# Patient Record
Sex: Male | Born: 1985 | Race: Black or African American | Hispanic: No | Marital: Married | Smoking: Never smoker
Health system: Southern US, Community
[De-identification: ages and names within clinical notes are randomized; demographics above are authoritative.]

---

## 2009-02-27 ENCOUNTER — Ambulatory Visit: Payer: Self-pay | Admitting: Family Medicine

## 2009-11-06 ENCOUNTER — Ambulatory Visit: Payer: Self-pay | Admitting: Family Medicine

## 2010-11-10 NOTE — Assessment & Plan Note (Signed)
Summary: SINUS & COUGH/KH   Vital Signs:  Patient Profile:   25 Years Old Male CC:      body aches/cold symptoms Height:     74 inches Weight:      227 pounds O2 Sat:      98 % O2 treatment:    Room Air Temp:     100.8 degrees F oral Pulse rate:   98 / minute Resp:     18 per minute BP sitting:   160 / 85  (right arm)  Pt. in pain?   yes    Location:   head    Type:       aching  Vitals Entered By: Lita Mains, RN                   Updated Prior Medication List: No Medications Current Allergies (reviewed today): No known allergies History of Present Illness History from: patient Chief Complaint: body aches/cold symptoms History of Present Illness: Patient c/o body aches and HA X 3 days. Last night the symptoms began to worsen and he developed runny nose, cough, eye pain, chills and sinus congestion. He complains his eye pain prevent him from being able to look left and right. Has has not taken any OTC meidcation. His current temp is 100.8 orally. NO VOMITING OR DIARRHEA. COUGH IS NON PRODUCTIVE AND CAUSES CHEST TO HURT.   REVIEW OF SYSTEMS Constitutional Symptoms       Complains of fever and chills.     Denies night sweats, weight loss, weight gain, and fatigue.  Eyes       Complains of eye pain.      Denies change in vision, eye discharge, glasses, contact lenses, and eye surgery. Ear/Nose/Throat/Mouth       Complains of frequent runny nose and sinus problems.      Denies hearing loss/aids, change in hearing, ear pain, ear discharge, dizziness, frequent nose bleeds, sore throat, hoarseness, and tooth pain or bleeding.  Respiratory       Complains of dry cough.      Denies productive cough, wheezing, shortness of breath, asthma, bronchitis, and emphysema/COPD.  Cardiovascular       Denies murmurs, chest pain, and tires easily with exhertion.    Gastrointestinal       Denies stomach pain, nausea/vomiting, diarrhea, constipation, blood in bowel movements, and  indigestion. Genitourniary       Denies painful urination, kidney stones, and loss of urinary control. Neurological       Complains of headaches.      Denies paralysis, seizures, and fainting/blackouts. Musculoskeletal       Complains of muscle pain and joint pain.      Denies joint stiffness, decreased range of motion, redness, swelling, muscle weakness, and gout.  Skin       Denies bruising, unusual mles/lumps or sores, and hair/skin or nail changes.  Psych       Denies mood changes, temper/anger issues, anxiety/stress, speech problems, depression, and sleep problems.  Past History:  Past Medical History: Reviewed history from 02/27/2009 and no changes required. Unremarkable  Past Surgical History: Reviewed history from 02/27/2009 and no changes required. Denies surgical history  Family History: none/unknown  Social History: Reviewed history from 02/27/2009 and no changes required. Married Never Smoked Alcohol use-yes, socially Drug use-no Regular exercise-yes Physical Exam General appearance: well developed, well nourished, no acute distress Eyes: CONJUNCTIVA INJECTED Ears: normal, no lesions or deformities Nasal: CONGESTED Oral/Pharynx: MILD ERYTHEMA Neck: neck  supple,  trachea midline, no masses Chest/Lungs: no rales, wheezes, or rhonchi bilateral, breath sounds equal without effort Heart: regular rate and  rhythm, no murmur Skin: no obvious rashes or lesions FEEL HE HAS URI OR POS INFLUENZA Assessment New Problems: UPPER RESPIRATORY INFECTION, ACUTE (ICD-465.9)   Plan New Medications/Changes: CHERATUSSIN AC 100-10 MG/5ML SYRP (GUAIFENESIN-CODEINE) 1-2 TSP by mouth Q 6 HRS as needed COUGH AND CONGESTION  #4 OZ x 0, 11/06/2009, Marvis Moeller DO AMOXICILLIN 500 MG CAPS (AMOXICILLIN) 1 by mouth TID  #30 x 0, 11/06/2009, Marvis Moeller DO  New Orders: Est. Patient Level II [62952]   Prescriptions: CHERATUSSIN AC 100-10 MG/5ML SYRP (GUAIFENESIN-CODEINE) 1-2  TSP by mouth Q 6 HRS as needed COUGH AND CONGESTION  #4 OZ x 0   Entered and Authorized by:   Marvis Moeller DO   Signed by:   Marvis Moeller DO on 11/06/2009   Method used:   Print then Give to Patient   RxID:   [id number] AMOXICILLIN 500 MG CAPS (AMOXICILLIN) 1 by mouth TID  #30 x 0   Entered and Authorized by:   Marvis Moeller DO   Signed by:   Marvis Moeller DO on 11/06/2009   Method used:   Print then Give to Patient   RxID:   [id number]   Patient Instructions: 1)  TAKE TYLENOL AT 12 AND 6, MOTRIN AT 3 AND 9 FOR FEVER AND DISCOMFORT. AVOID CAFFEINE AND MILK PRODUCTS. FOLLOW UP WITH YOUR PCP OR RETURN IF SYMPTOMS PERSIST OR WORSEN.

## 2010-11-10 NOTE — Letter (Signed)
Summary: Out of Work  [facility]  [address]   [place], [place] [address]   Phone: [phone]  Fax: [phone]    November 06, 2009   Employee:  DAIMIEN PATMON    To Whom It May Concern:   For Medical reasons, please excuse the above named employee from work for the following dates:  Start:   06 Nov 2009  End:   09 Nov 2009  If you need additional information, please feel free to contact our office.         Sincerely,    Marvis Moeller DO

## 2014-04-21 ENCOUNTER — Encounter: Payer: Self-pay | Admitting: Emergency Medicine

## 2014-04-21 ENCOUNTER — Emergency Department (INDEPENDENT_AMBULATORY_CARE_PROVIDER_SITE_OTHER)
Admission: EM | Admit: 2014-04-21 | Discharge: 2014-04-21 | Disposition: A | Discharge status: Home / Self Care | Payer: BC Managed Care – PPO | Source: Home / Self Care | Attending: Family Medicine | Admitting: Family Medicine

## 2014-04-21 DIAGNOSIS — K029 Dental caries, unspecified: Secondary | ICD-10-CM

## 2014-04-21 MED ORDER — TRAMADOL HCL 50 MG PO TABS: ORAL_TABLET | ORAL | Status: DC

## 2014-04-21 MED ORDER — CLINDAMYCIN HCL 300 MG PO CAPS: 300.0000 mg | ORAL_CAPSULE | Freq: Four times a day (QID) | ORAL | Status: DC

## 2014-04-21 NOTE — Discharge Instructions (Signed)
May take Ibuprofen 200mg , 4 tabs every 8 hours with food.  Begin warm compresses several times daily. If symptoms become significantly worse during the night or over the weekend, proceed to the local emergency room.    Dental Pain A tooth ache may be caused by cavities (tooth decay). Cavities expose the nerve of the tooth to air and hot or cold temperatures. It may come from an infection or abscess (also called a boil or furuncle) around your tooth. It is also often caused by dental caries (tooth decay). This causes the pain you are having. DIAGNOSIS  Your caregiver can diagnose this problem by exam. TREATMENT   If caused by an infection, it may be treated with medications which kill germs (antibiotics) and pain medications as prescribed by your caregiver. Take medications as directed.  Only take over-the-counter or prescription medicines for pain, discomfort, or fever as directed by your caregiver.  Whether the tooth ache today is caused by infection or dental disease, you should see your dentist as soon as possible for further care. SEEK MEDICAL CARE IF: The exam and treatment you received today has been provided on an emergency basis only. This is not a substitute for complete medical or dental care. If your problem worsens or new problems (symptoms) appear, and you are unable to meet with your dentist, call or return to this location. SEEK IMMEDIATE MEDICAL CARE IF:   You have a fever.  You develop redness and swelling of your face, jaw, or neck.  You are unable to open your mouth.  You have severe pain uncontrolled by pain medicine. MAKE SURE YOU:   Understand these instructions.  Will watch your condition.  Will get help right away if you are not doing well or get worse. Document Released: 09/27/2005 Document Revised: 12/20/2011 Document Reviewed: 05/15/2008 [facility] Patient Information 2015 [place], [place]. This information is not intended to replace advice given to you by  your health care provider. Make sure you discuss any questions you have with your health care provider.

## 2014-04-21 NOTE — ED Provider Notes (Signed)
CSN: [id number]     Arrival date & time 04/21/14  1512 History   First MD Initiated Contact with Patient 04/21/14 1557     Chief Complaint  Patient presents with  . Dental Pain    R lower post.      HPI Comments: Patient complains of soreness in a right lower tooth for a week.  The tooth has become painful over the past 3 days, with pain radiating to his right cheek and neck.  He has had night sweats for the past two days.  He has a dental appointment scheduled in 3 days.  Patient is a 28 y.o. male presenting with tooth pain. The history is provided by the patient.  Dental Pain Location:  Lower Lower teeth location:  32/RL 3rd molar Quality:  Aching Severity:  Moderate Onset quality:  Gradual Duration:  3 days Timing:  Constant Progression:  Worsening Chronicity:  Recurrent Relieved by:  Nothing Exacerbated by: eating. Ineffective treatments:  NSAIDs Associated symptoms: facial pain, facial swelling, headaches and neck pain   Associated symptoms: no congestion, no difficulty swallowing, no drooling, no fever, no gum swelling, no neck swelling, no oral bleeding, no oral lesions and no trismus     History reviewed. No pertinent past medical history. History reviewed. No pertinent past surgical history. History reviewed. No pertinent family history. History  Substance Use Topics  . Smoking status: Never Smoker   . Smokeless tobacco: Never Used  . Alcohol Use: Yes     Comment: 4 X wk    Review of Systems  Constitutional: Negative for fever.  HENT: Positive for facial swelling. Negative for congestion, drooling and mouth sores.   Musculoskeletal: Positive for neck pain.  Neurological: Positive for headaches.  All other systems reviewed and are negative.   Allergies  Review of patient's allergies indicates no known allergies.  Home Medications   Prior to Admission medications   Medication Sig Start Date End Date Taking? Authorizing Provider  clindamycin (CLEOCIN) 300  MG capsule Take 1 capsule (300 mg total) by mouth 4 (four) times daily. (every 6 hours) 04/21/14   Lattie HawStephen A Belma Dyches, MD  traMADol (ULTRAM) 50 MG tablet Take one tab by mouth ever 4 to 6 hours as needed for pain.  Take one or two tabs at bedtime as needed for pain Maximum dose= 8 tablets per day 04/21/14   Lattie HawStephen A Kirt Chew, MD   BP 149/86  Pulse 89  Temp(Src) 98.3 F (36.8 C) (Oral)  Ht 6\' 2"  (1.88 m)  Wt 220 lb (99.791 kg)  BMI 28.23 kg/m2  SpO2 100% Physical Exam  Nursing note and vitals reviewed. Constitutional: He is oriented to person, place, and time. He appears well-developed and well-nourished. No distress.  HENT:  Head: Normocephalic.  Mouth/Throat: Uvula is midline and mucous membranes are normal. No oral lesions. No trismus in the jaw. No uvula swelling. No oropharyngeal exudate, posterior oropharyngeal edema or posterior oropharyngeal erythema.    There is mild tenderness over the right parotid gland although it is not swollen. There is tenderness to tap over the marked right mandibular tooth.  Eyes: Conjunctivae are normal. Pupils are equal, round, and reactive to light.  Neck: Neck supple.  Cardiovascular: Normal heart sounds.   Pulmonary/Chest: Breath sounds normal.  Abdominal: There is no tenderness.  Lymphadenopathy:    He has cervical adenopathy.  Neurological: He is alert and oriented to person, place, and time.  Skin: Skin is warm and dry. No rash noted.  ED Course  Procedures  none     MDM   1. Pain due to dental caries    Begin Clindamycin. Tramadol for pain. May take Ibuprofen 200mg , 4 tabs every 8 hours with food.  Begin warm compresses several times daily. If symptoms become significantly worse during the night or over the weekend, proceed to the local emergency room. Followup with dentist in 3 days as scheduled.    Lattie Haw, MD 04/25/14 [phone]

## 2014-04-21 NOTE — ED Notes (Signed)
Tooth ache X 3 days.  Radiated to R ear and neck.  Unable to get dental appt until next Friday.

## 2015-01-17 ENCOUNTER — Emergency Department (INDEPENDENT_AMBULATORY_CARE_PROVIDER_SITE_OTHER)
Admission: EM | Admit: 2015-01-17 | Discharge: 2015-01-17 | Disposition: A | Discharge status: Home / Self Care | Payer: BLUE CROSS/BLUE SHIELD | Source: Home / Self Care | Attending: Emergency Medicine | Admitting: Emergency Medicine

## 2015-01-17 ENCOUNTER — Emergency Department (INDEPENDENT_AMBULATORY_CARE_PROVIDER_SITE_OTHER): Payer: BLUE CROSS/BLUE SHIELD

## 2015-01-17 ENCOUNTER — Encounter: Payer: Self-pay | Admitting: *Deleted

## 2015-01-17 DIAGNOSIS — R05 Cough: Secondary | ICD-10-CM | POA: Diagnosis not present

## 2015-01-17 DIAGNOSIS — R079 Chest pain, unspecified: Secondary | ICD-10-CM | POA: Diagnosis not present

## 2015-01-17 DIAGNOSIS — R0981 Nasal congestion: Secondary | ICD-10-CM | POA: Diagnosis not present

## 2015-01-17 DIAGNOSIS — K2100 Gastro-esophageal reflux disease with esophagitis, without bleeding: Secondary | ICD-10-CM

## 2015-01-17 DIAGNOSIS — R52 Pain, unspecified: Secondary | ICD-10-CM

## 2015-01-17 MED ORDER — GI COCKTAIL ~~LOC~~
30.0000 mL | Freq: Once | ORAL | Status: AC
  Administered 2015-01-17: 30 mL via ORAL

## 2015-01-17 MED ORDER — OMEPRAZOLE 20 MG PO CPDR: 20.0000 mg | DELAYED_RELEASE_CAPSULE | Freq: Every day | ORAL | Status: AC

## 2015-01-17 NOTE — Discharge Instructions (Signed)
Esophagitis Esophagitis is inflammation of the esophagus. It can involve swelling, soreness, and pain in the esophagus. This condition can make it difficult and painful to swallow. CAUSES  Most causes of esophagitis are not serious. Many different factors can cause esophagitis, including:  Gastroesophageal reflux disease (GERD). This is when acid from your stomach flows up into the esophagus.  Recurrent vomiting.  An allergic-type reaction.  Certain medicines, especially those that come in large pills.  Ingestion of harmful chemicals, such as household cleaning products.  Heavy alcohol use.  An infection of the esophagus.  Radiation treatment for cancer.  Certain diseases such as sarcoidosis, Crohn's disease, and scleroderma. These diseases may cause recurrent esophagitis. SYMPTOMS   Trouble swallowing.  Painful swallowing.  Chest pain.  Difficulty breathing.  Nausea.  Vomiting.  Abdominal pain. DIAGNOSIS  Your caregiver will take your history and do a physical exam. Depending upon what your caregiver finds, certain tests may also be done, including:  Barium X-ray. You will drink a solution that coats the esophagus, and X-rays will be taken.  Endoscopy. A lighted tube is put down the esophagus so your caregiver can examine the area.  Allergy tests. These can sometimes be arranged through follow-up visits. TREATMENT  Treatment will depend on the cause of your esophagitis. In some cases, steroids or other medicines may be given to help relieve your symptoms or to treat the underlying cause of your condition. Medicines that may be recommended include:  Viscous lidocaine, to soothe the esophagus.  Antacids.  Acid reducers.  Proton pump inhibitors.  Antiviral medicines for certain viral infections of the esophagus.  Antifungal medicines for certain fungal infections of the esophagus.  Antibiotic medicines, depending on the cause of the esophagitis. HOME CARE  INSTRUCTIONS   Avoid foods and drinks that seem to make your symptoms worse.  Eat small, frequent meals instead of large meals.  Avoid eating for the 3 hours prior to your bedtime.  If you have trouble taking pills, use a pill splitter to decrease the size and likelihood of the pill getting stuck or injuring the esophagus on the way down. Drinking water after taking a pill also helps.  Stop smoking if you smoke.  Maintain a healthy weight.  Wear loose-fitting clothing. Do not wear anything tight around your waist that causes pressure on your stomach.  Raise the head of your bed 6 to 8 inches with wood blocks to help you sleep. Extra pillows will not help.  Only take over-the-counter or prescription medicines as directed by your caregiver. SEEK IMMEDIATE MEDICAL CARE IF:  You have severe chest pain that radiates into your arm, neck, or jaw.  You feel sweaty, dizzy, or lightheaded.  You have shortness of breath.  You vomit blood.  You have difficulty or pain with swallowing.  You have bloody or black, tarry stools.  You have a fever.  You have a burning sensation in the chest more than 3 times a week for more than 2 weeks.  You cannot swallow, drink, or eat.  You drool because you cannot swallow your saliva. MAKE SURE YOU:  Understand these instructions.  Will watch your condition.  Will get help right away if you are not doing well or get worse. Document Released: 11/04/2004 Document Revised: 12/20/2011 Document Reviewed: 05/28/2011 ExitCare Patient Information 2015 [employer]. This information is not intended to replace advice given to you by your health care provider. Make sure you discuss any questions you have with your health care provider.  

## 2015-01-17 NOTE — ED Notes (Signed)
Pt c/o 9 days of constant central CP described as tightness. Feels like pain moves into throat. No medical hx or family history.

## 2015-01-17 NOTE — ED Provider Notes (Signed)
CSN: [id number]     Arrival date & time 01/17/15  1518 History   First MD Initiated Contact with Patient 01/17/15 1524     Chief Complaint  Patient presents with  . Chest Pain   (Consider location/radiation/quality/duration/timing/severity/associated sxs/prior Treatment) Patient is a 29 y.o. male presenting with chest pain. The history is provided by the patient. No language interpreter was used.  Chest Pain Pain location:  Epigastric and substernal area Pain quality: aching and burning   Pain radiates to:  Does not radiate Pain radiates to the back: no   Pain severity:  Moderate Onset quality:  Gradual Duration:  9 days Progression:  Worsening Chronicity:  New Context: not breathing   Relieved by:  Nothing Worsened by:  Nothing tried Ineffective treatments:  None tried Associated symptoms: no abdominal pain   Risk factors: no hypertension    patient reports he has had pain in his mid chest for the last 9 days. pain is constant. Patient describes pain as burning reports he feels like it moves up and down in his throat patient denies any fever or chills he has a history of borderline hypertension but is not on any medicines  History reviewed. No pertinent past medical history. History reviewed. No pertinent past surgical history. History reviewed. No pertinent family history. History  Substance Use Topics  . Smoking status: Never Smoker   . Smokeless tobacco: Never Used  . Alcohol Use: Yes     Comment: 4 X wk    Review of Systems  Cardiovascular: Positive for chest pain.  Gastrointestinal: Negative for abdominal pain.  All other systems reviewed and are negative.   Allergies  Review of patient's allergies indicates no known allergies.  Home Medications   Prior to Admission medications   Not on File   BP 142/95 mmHg  Pulse 93  Resp 14  SpO2 99% Physical Exam  Constitutional: He is oriented to person, place, and time. He appears well-developed and well-nourished.   HENT:  Head: Normocephalic.  Eyes: Conjunctivae and EOM are normal. Pupils are equal, round, and reactive to light.  Neck: Normal range of motion.  Cardiovascular: Normal rate and regular rhythm.   Pulmonary/Chest: Effort normal.  Abdominal: Soft. He exhibits no distension.  Musculoskeletal: Normal range of motion.  Neurological: He is alert and oriented to person, place, and time.  Skin: Skin is warm.  Psychiatric: He has a normal mood and affect.  Nursing note and vitals reviewed.   ED Course  Procedures (including critical care time) Labs Review Labs Reviewed - No data to display  Imaging Review Dg Chest 2 View  01/17/2015   CLINICAL DATA:  Chest pain, cough, nasal congestion  EXAM: CHEST  2 VIEW  COMPARISON:  None.  FINDINGS: The heart size and mediastinal contours are within normal limits. Both lungs are clear. The visualized skeletal structures are unremarkable.  IMPRESSION: No active cardiopulmonary disease.   Electronically Signed   By: Natasha MeadLiviu  Pop M.D.   On: 01/17/2015 16:26   EKG normal sinus normal EKG no ST changes normal QRS  MDM  reports relief with GI cocktail. He reports symptoms resolved however they are starting to come back. Patient's chest x-ray is normal EKG is normal I doubt this is cardiac or pulmonary I think this is probably secondary to an esophagitis I will start patient on Prilosec for treatment of symptoms he is given referrals for primary care physician to follow-up with    1. Gastroesophageal reflux disease with esophagitis  2. Pain    prilosec Avs   Elson Areas, PA-C 01/17/15 2017  Lonia Skinner Josephine, PA-C 01/17/15 2018

## 2015-04-15 ENCOUNTER — Emergency Department (INDEPENDENT_AMBULATORY_CARE_PROVIDER_SITE_OTHER)
Admission: EM | Admit: 2015-04-15 | Discharge: 2015-04-15 | Disposition: A | Discharge status: Home / Self Care | Payer: BLUE CROSS/BLUE SHIELD | Source: Home / Self Care | Attending: Emergency Medicine | Admitting: Emergency Medicine

## 2015-04-15 ENCOUNTER — Encounter: Payer: Self-pay | Admitting: *Deleted

## 2015-04-15 ENCOUNTER — Emergency Department (INDEPENDENT_AMBULATORY_CARE_PROVIDER_SITE_OTHER): Payer: BLUE CROSS/BLUE SHIELD

## 2015-04-15 DIAGNOSIS — R079 Chest pain, unspecified: Secondary | ICD-10-CM

## 2015-04-15 DIAGNOSIS — J209 Acute bronchitis, unspecified: Secondary | ICD-10-CM | POA: Diagnosis not present

## 2015-04-15 DIAGNOSIS — R05 Cough: Secondary | ICD-10-CM

## 2015-04-15 MED ORDER — LANSOPRAZOLE 15 MG PO TBDP: ORAL_TABLET | ORAL | Status: AC

## 2015-04-15 MED ORDER — AMOXICILLIN 500 MG PO CAPS: 500.0000 mg | ORAL_CAPSULE | Freq: Three times a day (TID) | ORAL | Status: AC

## 2015-04-15 NOTE — ED Provider Notes (Signed)
CSN: [id number]     Arrival date & time 04/15/15  1155 History   First MD Initiated Contact with Patient 04/15/15 1157     Chief Complaint  Patient presents with  . Chest Pain  . Cough   (Consider location/radiation/quality/duration/timing/severity/associated sxs/prior Treatment) Patient is a 29 y.o. male presenting with cough. The history is provided by the patient. No language interpreter was used.  Cough Cough characteristics:  Non-productive Severity:  Moderate Onset quality:  Gradual Duration:  5 days Timing:  Constant Progression:  Worsening Chronicity:  New Smoker: no   Context: not sick contacts   Relieved by:  Nothing Worsened by:  Nothing tried Ineffective treatments:  None tried Associated symptoms: shortness of breath   Risk factors: no recent infection   Pt reports he has been coughing for 5 days.  Pt complains of pain with coughing  History reviewed. No pertinent past medical history. History reviewed. No pertinent past surgical history. History reviewed. No pertinent family history. History  Substance Use Topics  . Smoking status: Never Smoker   . Smokeless tobacco: Never Used  . Alcohol Use: Yes     Comment: 4 X wk    Review of Systems  Respiratory: Positive for cough and shortness of breath.   All other systems reviewed and are negative.   Allergies  Review of patient's allergies indicates no known allergies.  Home Medications   Prior to Admission medications   Medication Sig Start Date End Date Taking? Authorizing Provider  omeprazole (PRILOSEC) 20 MG capsule Take 1 capsule (20 mg total) by mouth daily. 01/17/15   Elson AreasLeslie K Danyell Awbrey, PA-C   BP 157/94 mmHg  Pulse 100  Temp(Src) 98.2 F (36.8 C) (Oral)  Resp 18  SpO2 97% Physical Exam  Constitutional: He appears well-developed.  HENT:  Head: Normocephalic and atraumatic.  Eyes: Pupils are equal, round, and reactive to light.  Neck: Normal range of motion.  Cardiovascular: Normal rate, regular  rhythm and normal heart sounds.   Pulmonary/Chest: Effort normal and breath sounds normal.  Abdominal: Soft.  Musculoskeletal: Normal range of motion.  Neurological: He is alert.  Skin: Skin is warm.  Psychiatric: He has a normal mood and affect.    ED Course  Procedures (including critical care time) Labs Review Labs Reviewed - No data to display  Imaging Review No results found.   MDM   1. Acute bronchitis, unspecified organism    Meds ordered this encounter  Medications  . amoxicillin (AMOXIL) 500 MG capsule    Sig: Take 1 capsule (500 mg total) by mouth 3 (three) times daily.    Dispense:  30 capsule    Refill:  0    Order Specific Question:  Supervising Provider    Answer:  Georgina PillionMASSEY, DAVID [[id number]]  . lansoprazole (PREVACID SOLUTAB) 15 MG disintegrating tablet    Sig: One tablet a day    Dispense:  30 tablet    Refill:  0    Order Specific Question:  Supervising Provider    AnswerGeorgina Pillion:  MASSEY, DAVID [[id number]]   avs Follow up with your Physician for recheck.   Lonia SkinnerLeslie K West PittstonSofia, PA-C 04/16/15 1435  Gilda Creasehristopher J Pollina, MD 04/16/15 [phone]

## 2015-04-15 NOTE — ED Notes (Signed)
Pt c/o 5 days of substernal sharp chest pain and a persistent cough. [facility] or a fan hits his face or he talks, he gets SOB and cannot stop coughing. He is having trouble talking in complete sentences for coughing.

## 2015-04-15 NOTE — Discharge Instructions (Signed)

## 2016-11-09 IMAGING — CR DG CHEST 2V
2 series · 2 of 2 positions shown · non-contrast
Comparison: None.

CLINICAL DATA: Chest pain, cough, nasal congestion

EXAM:
CHEST  2 VIEW

[chest pa]
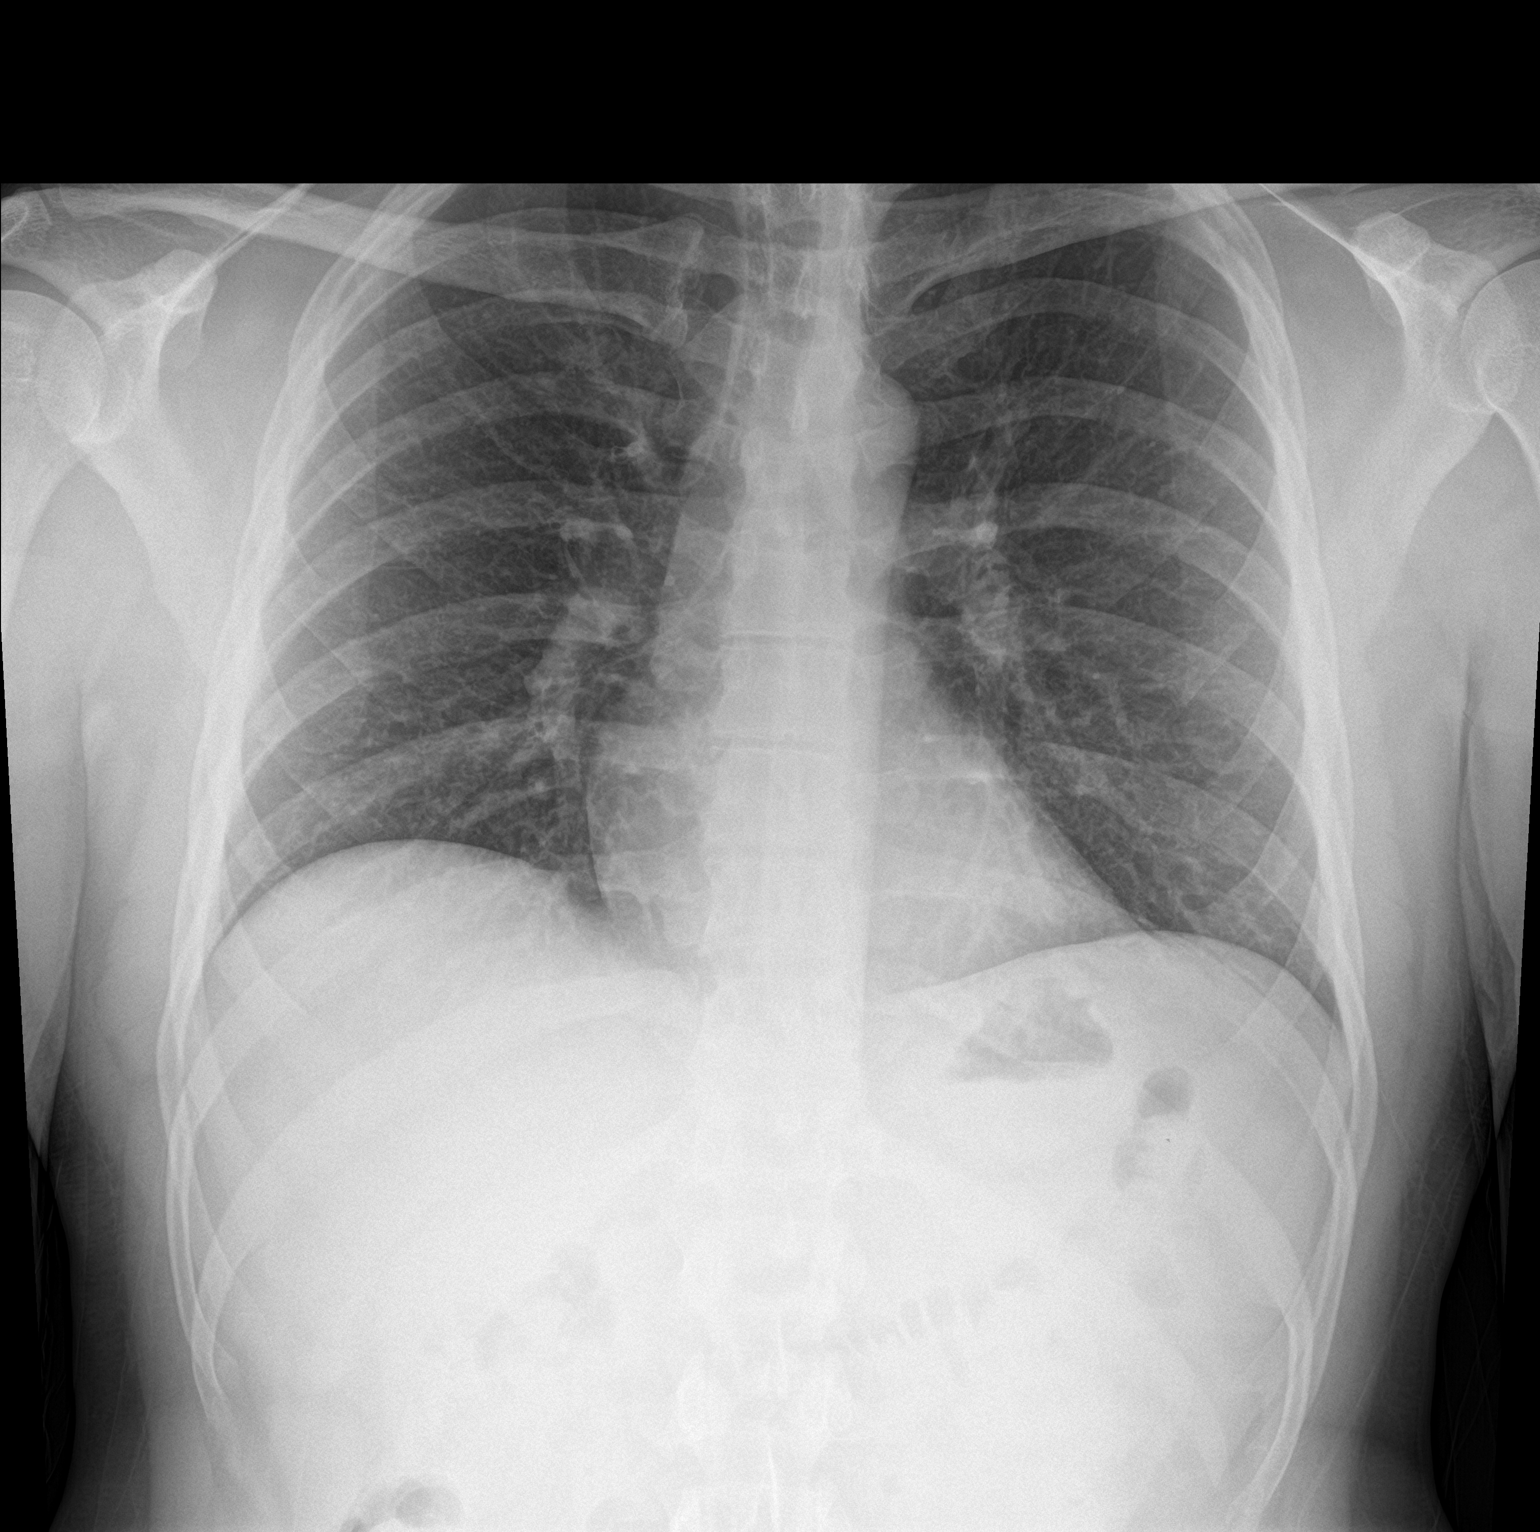

[chest lat]
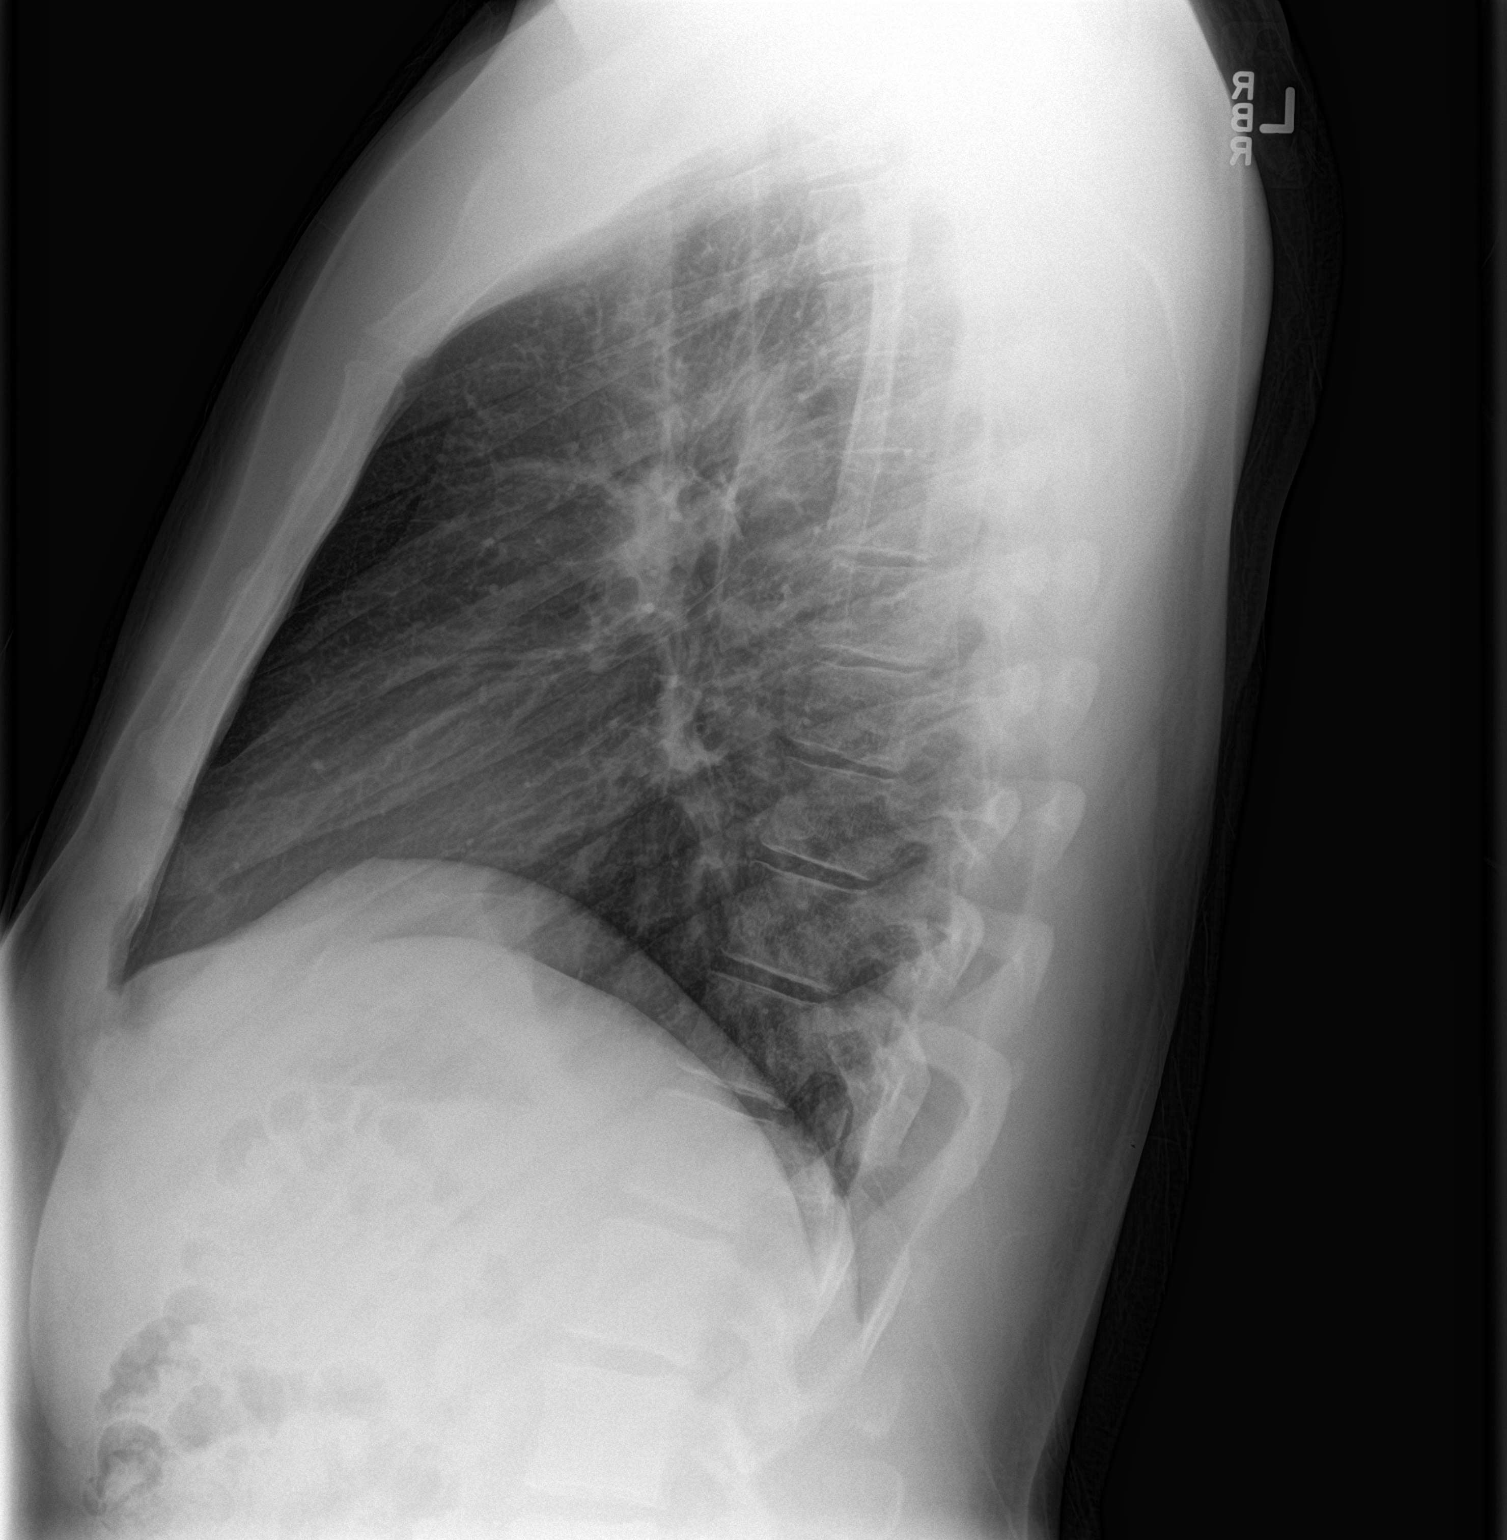

[2 of 2 positions shown; findings below may reference images not displayed]

FINDINGS: The heart size and mediastinal contours are within normal limits.
Both lungs are clear. The visualized skeletal structures are
unremarkable.
IMPRESSION: No active cardiopulmonary disease.
